# Patient Record
Sex: Male | Born: 1980 | Race: Black or African American | Hispanic: No | Marital: Single | Smoking: Current every day smoker
Health system: Southern US, Community
[De-identification: ages and names within clinical notes are randomized; demographics above are authoritative.]

---

## 2015-11-11 ENCOUNTER — Emergency Department (HOSPITAL_BASED_OUTPATIENT_CLINIC_OR_DEPARTMENT_OTHER)
Admission: EM | Admit: 2015-11-11 | Discharge: 2015-11-11 | Disposition: A | Discharge status: Home / Self Care | Payer: Self-pay | Attending: Emergency Medicine | Admitting: Emergency Medicine

## 2015-11-11 ENCOUNTER — Emergency Department (HOSPITAL_BASED_OUTPATIENT_CLINIC_OR_DEPARTMENT_OTHER): Payer: Self-pay

## 2015-11-11 ENCOUNTER — Encounter (HOSPITAL_BASED_OUTPATIENT_CLINIC_OR_DEPARTMENT_OTHER): Payer: Self-pay | Admitting: *Deleted

## 2015-11-11 DIAGNOSIS — R0602 Shortness of breath: Secondary | ICD-10-CM | POA: Insufficient documentation

## 2015-11-11 DIAGNOSIS — R197 Diarrhea, unspecified: Secondary | ICD-10-CM | POA: Insufficient documentation

## 2015-11-11 DIAGNOSIS — R630 Anorexia: Secondary | ICD-10-CM | POA: Insufficient documentation

## 2015-11-11 DIAGNOSIS — R110 Nausea: Secondary | ICD-10-CM | POA: Insufficient documentation

## 2015-11-11 DIAGNOSIS — R079 Chest pain, unspecified: Secondary | ICD-10-CM | POA: Insufficient documentation

## 2015-11-11 DIAGNOSIS — F17210 Nicotine dependence, cigarettes, uncomplicated: Secondary | ICD-10-CM | POA: Insufficient documentation

## 2015-11-11 LAB — CBC
HCT: 45.9 % (ref 39.0–52.0)
HEMOGLOBIN: 15.6 g/dL (ref 13.0–17.0)
MCH: 28.5 pg (ref 26.0–34.0)
MCHC: 34 g/dL (ref 30.0–36.0)
MCV: 83.8 fL (ref 78.0–100.0)
Platelets: 206 10*3/uL (ref 150–400)
RBC: 5.48 MIL/uL (ref 4.22–5.81)
RDW: 13.7 % (ref 11.5–15.5)
WBC: 6 10*3/uL (ref 4.0–10.5)

## 2015-11-11 LAB — BASIC METABOLIC PANEL
Anion gap: 9 (ref 5–15)
BUN: 15 mg/dL (ref 6–20)
CALCIUM: 8.6 mg/dL — AB (ref 8.9–10.3)
CO2: 25 mmol/L (ref 22–32)
CREATININE: 1 mg/dL (ref 0.61–1.24)
Chloride: 103 mmol/L (ref 101–111)
GFR calc Af Amer: >60 mL/min (ref >60)
GFR calc non Af Amer: >60 mL/min (ref >60)
GLUCOSE: 126 mg/dL — AB (ref 65–99)
Potassium: 3.9 mmol/L (ref 3.5–5.1)
Sodium: 137 mmol/L (ref 135–145)

## 2015-11-11 LAB — TROPONIN I

## 2015-11-11 MED ORDER — IOHEXOL 350 MG/ML SOLN
100.0000 mL | Freq: Once | INTRAVENOUS | Status: AC | PRN
  Administered 2015-11-11: 100 mL via INTRAVENOUS

## 2015-11-11 NOTE — ED Provider Notes (Signed)
CSN: [id number]     Arrival date & time 11/11/15  1349 History   First MD Initiated Contact with Patient 11/11/15 1500     Chief Complaint  Patient presents with  . Chest Pain     (Consider location/radiation/quality/duration/timing/severity/associated sxs/prior Treatment) HPI   Patient is a 35 year old male with no pertinent past medical history who presents the ED with complaint of chest pain, onset 1 month. Patient reports having a constant dull ache to his left lateral chest for the past month. Reports pain as 4/10. Denies any aggravating or alleviating factors. Patient also reports he has been feeling short of breath for the past month and further explains "I feel like I just can't take a full deep breath". Patient also reports having decreased energy and decreased appetite. He notes he has felt nauseous daily for the past week and reports dry heaving. Patient reports having proximally 3 episodes of nonbloody diarrhea daily for the past month. Denies fever, chills, headache, lightheadedness, dizziness, nasal congestion, rhinorrhea, sore throat, cough, palpitations, wheezing, abdominal pain, vomiting, urinary symptoms, numbness, tingling, weakness. Patient reports he quit smoking 2 weeks ago but endorses smoking 1 pack per day. Patient reports having an extensive family cardiac history. He notes his dad died at the age of 22 from an MI in 23 of his brothers died from MIs at the ages of 76, 27 and 48.   History reviewed. No pertinent past medical history. History reviewed. No pertinent past surgical history. History reviewed. No pertinent family history. Social History  Substance Use Topics  . Smoking status: Current Every Day Smoker    Types: Cigarettes  . Smokeless tobacco: None  . Alcohol Use: No    Review of Systems  Constitutional: Positive for appetite change (decreased) and fatigue.  Respiratory: Positive for shortness of breath.   Cardiovascular: Positive for chest pain.   Gastrointestinal: Positive for nausea and diarrhea.  All other systems reviewed and are negative.     Allergies  Review of patient's allergies indicates no known allergies.  Home Medications   Prior to Admission medications   Not on File   BP 128/90 mmHg  Pulse 70  Temp(Src) 97.9 F (36.6 C) (Oral)  Resp 16  Ht  (1.88 m)  Wt 113.399 kg  BMI 32.08 kg/m2  SpO2 98% Physical Exam  Constitutional: He is oriented to person, place, and time. He appears well-developed and well-nourished. No distress.  HENT:  Head: Normocephalic and atraumatic.  Mouth/Throat: Oropharynx is clear and moist. No oropharyngeal exudate.  Eyes: Conjunctivae and EOM are normal. Right eye exhibits no discharge. Left eye exhibits no discharge. No scleral icterus.  Neck: Normal range of motion. Neck supple.  Cardiovascular: Normal rate, regular rhythm, normal heart sounds and intact distal pulses.   Pulmonary/Chest: Effort normal and breath sounds normal. No respiratory distress. He has no wheezes. He has no rales. He exhibits no tenderness.  Abdominal: Soft. Bowel sounds are normal. He exhibits no distension and no mass. There is no tenderness. There is no rebound and no guarding.  Musculoskeletal: Normal range of motion. He exhibits no edema.  Lymphadenopathy:    He has no cervical adenopathy.  Neurological: He is alert and oriented to person, place, and time.  Skin: Skin is warm and dry. He is not diaphoretic.  Nursing note and vitals reviewed.   ED Course  Procedures (including critical care time) Labs Review Labs Reviewed  BASIC METABOLIC PANEL - Abnormal; Notable for the following:    Glucose,  Bld 126 (*)    Calcium 8.6 (*)    All other components within normal limits  CBC  TROPONIN I    Imaging Review Dg Chest 2 View  11/11/2015  CLINICAL DATA:  Mid chest pain and shortness of breath for 1 month. EXAM: CHEST  2 VIEW COMPARISON:  None. FINDINGS: The heart size and mediastinal contours  are within normal limits. Both lungs are clear. The visualized skeletal structures are unremarkable. IMPRESSION: No active cardiopulmonary disease. Electronically Signed   By: Kennith CenterEric  Mansell M.D.   On: 11/11/2015 15:11   Ct Angio Chest Pe W/cm &/or Wo Cm  11/11/2015  CLINICAL DATA:  Chest pain, shortness of Breath. EXAM: CT ANGIOGRAPHY CHEST WITH CONTRAST TECHNIQUE: Multidetector CT imaging of the chest was performed using the standard protocol during bolus administration of intravenous contrast. Multiplanar CT image reconstructions and MIPs were obtained to evaluate the vascular anatomy. CONTRAST:  100mL OMNIPAQUE IOHEXOL 350 MG/ML SOLN COMPARISON:  11/11/2015 chest x-ray FINDINGS: Mediastinum/Nodes: No filling defects in the pulmonary arteries to suggest pulmonary emboli. Heart is normal size. Aorta is normal caliber. No mediastinal, hilar, or axillary adenopathy. Lungs/Pleura: Lungs are clear. No focal airspace opacities or suspicious nodules. No effusions. Upper abdomen: Imaging into the upper abdomen shows no acute findings. Musculoskeletal: No acute bony abnormality or focal bone lesion. Review of the MIP images confirms the above findings. IMPRESSION: No evidence of pulmonary embolus.  No acute findings. Electronically Signed   By: Charlett NoseKevin  Dover M.D.   On: 11/11/2015 17:09   I have personally reviewed and evaluated these images and lab results as part of my medical decision-making.   EKG Interpretation   Date/Time:  Saturday November 11 2015 14:00:17 EST Ventricular Rate:  73 PR Interval:  172 QRS Duration: 104 QT Interval:  374 QTC Calculation: 412 R Axis:   50 Text Interpretation:  Normal sinus rhythm Normal ECG No old tracing to  compare Confirmed by JACUBOWITZ  MD, SAM [phone]([id number]) on 11/11/2015 2:07:50 PM      MDM   Final diagnoses:  Chest pain, unspecified chest pain type  SOB (shortness of breath)    Patient presents with constant chest pain and shortness of breath that he has had  daily for the past month. Endorses extensive family cardiac history, denies any personal cardiac history, hospitalizations or cardiac workup. [place] VSS. Exam unremarkable. EKG revealed normal sinus rhythm, no old tracings to compare. Chest x-ray negative. Troponin negative. Labs unremarkable. Due to patient's continued symptoms of chest pain and shortness of breath, will order CT angio of chest to further evaluation. CT angio negative. I have a low suspicion for ACS, PE, dissection, or other acute cardiac event at this time. Discussed results and plan for discharge with patient. Patient given resources to follow up with PCP and cardiology.  Evaluation does not show pathology requring ongoing emergent intervention or admission. Pt is hemodynamically stable and mentating appropriately. Discussed findings/results and plan with patient/guardian, who agrees with plan. All questions answered. Return precautions discussed and outpatient follow up given.        Satira Sarkicole Elizabeth KilbourneNadeau, [place] JerseyPA-C 11/11/15 1727  Arby BarretteMarcy Pfeiffer, MD 11/15/15 0800

## 2015-11-11 NOTE — ED Notes (Signed)
Pt placed on Automatic Vitals and continuous cardiac monitoring and pulse ox.

## 2015-11-11 NOTE — ED Notes (Signed)
Extensive family hx of cardiac death early due to MI (3 brothers and dad before age 37).  Pt reports not feeling right, chest tightness and SOB over the last couple weeks.  Decreased appetite, difficulty sleeping.

## 2015-11-11 NOTE — Discharge Instructions (Signed)
I recommend taking ibuprofen as prescribed over-the-counter as needed for chest pain. Call the cardiology clinic listed above to schedule a follow-up appointment. Please return to the Emergency Department if symptoms worsen or new onset of fever, difficulty breathing, cough, wheezing, palpitations, abdominal pain, nausea, vomiting, lightheadedness, dizziness, numbness, tingling, weakness, leg swelling.

## 2017-09-04 IMAGING — CT CT ANGIO CHEST
2 of 10 series · 19 of 46 positions shown · IV contrast (omnipaque)
Comparison: 11/11/2015 chest x-ray

CLINICAL DATA: Chest pain, shortness of Breath.

EXAM:
CT ANGIOGRAPHY CHEST WITH CONTRAST
TECHNIQUE: Multidetector CT imaging of the chest was performed using the
standard protocol during bolus administration of intravenous
contrast. Multiplanar CT image reconstructions and MIPs were
obtained to evaluate the vascular anatomy.
CONTRAST:  100mL OMNIPAQUE IOHEXOL 350 MG/ML SOLN

[Series 8: thins · axial · 0.83mm/px · z∈[-322,-35]mm · 16 of 319 slices shown]
[im 16/319  lung]
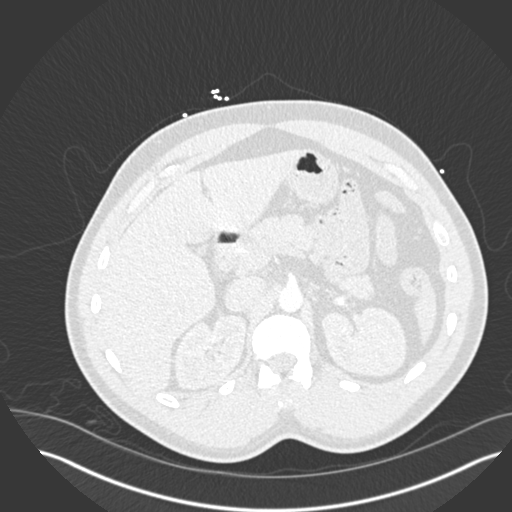
[im 31/319  soft-tissue]
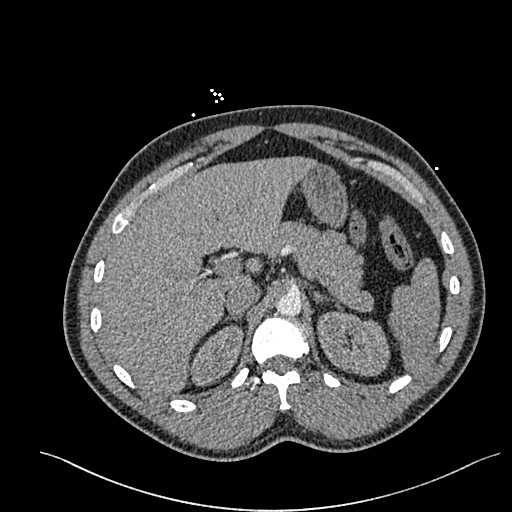
[im 61/319  lung]
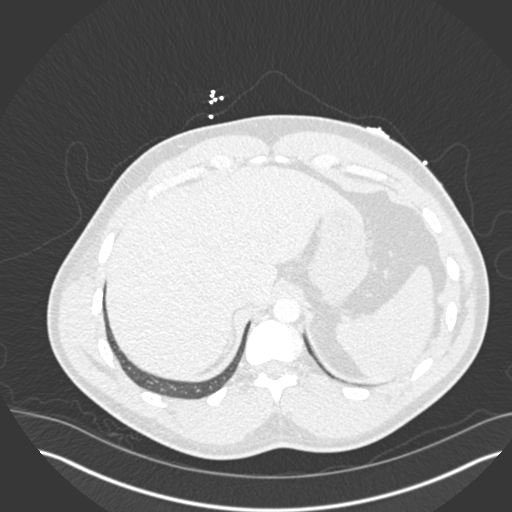
[im 76/319  soft-tissue]
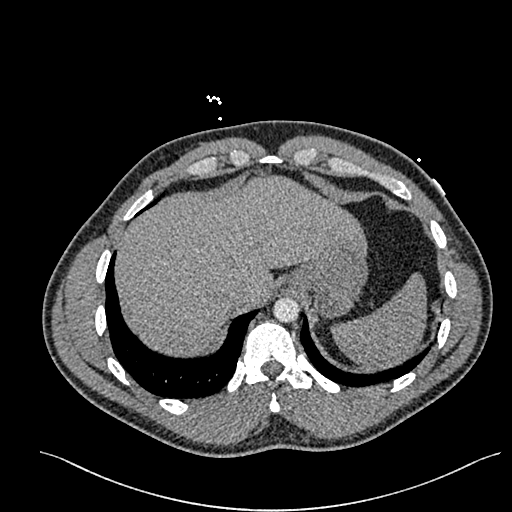
[im 91/319  lung]
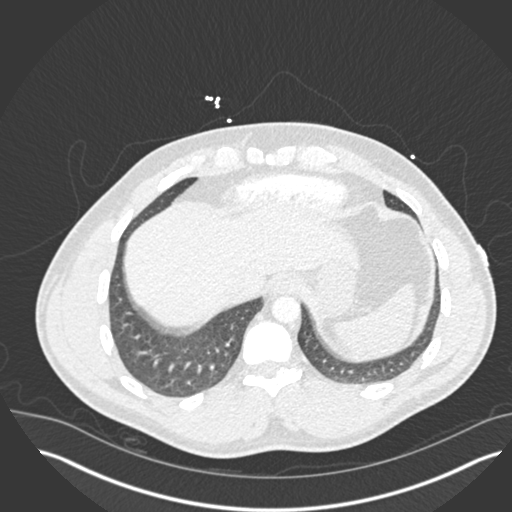
[im 107/319  soft-tissue]
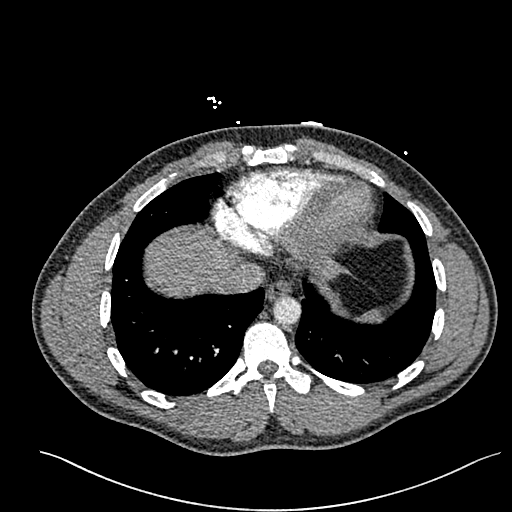
[im 137/319  lung]
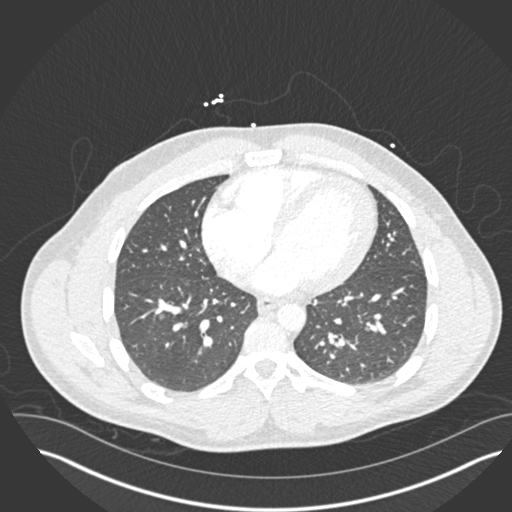
[im 152/319  soft-tissue]
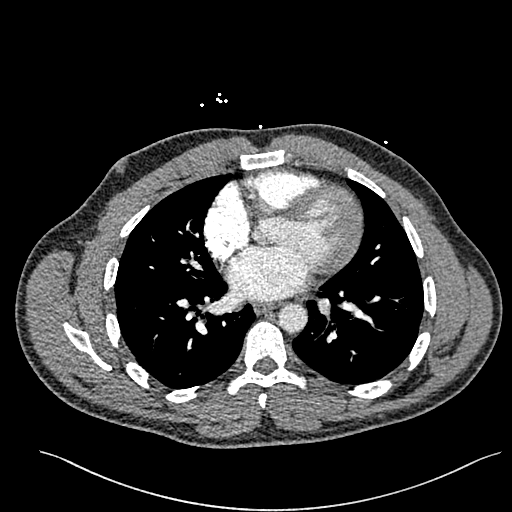
[im 167/319  lung]
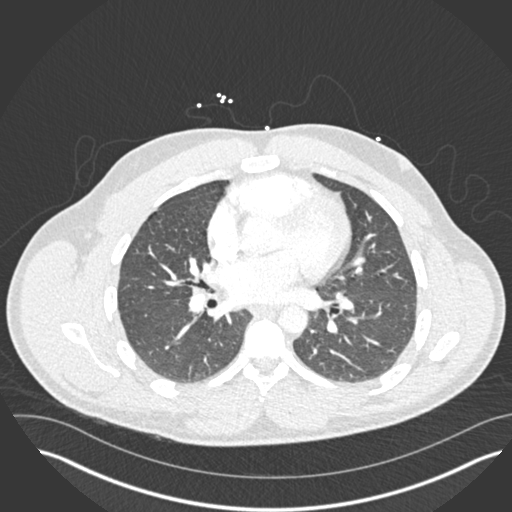
[im 182/319  soft-tissue]
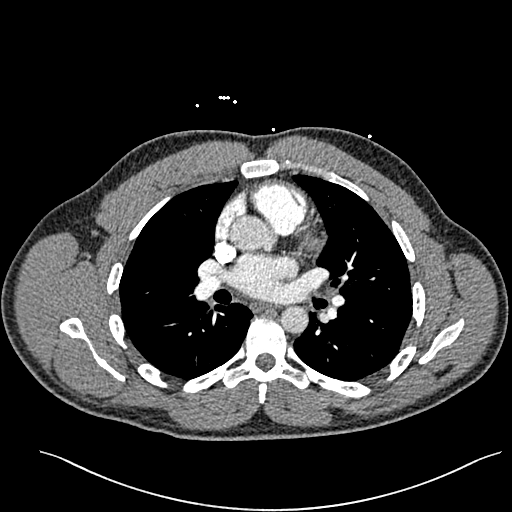
[im 213/319  lung]
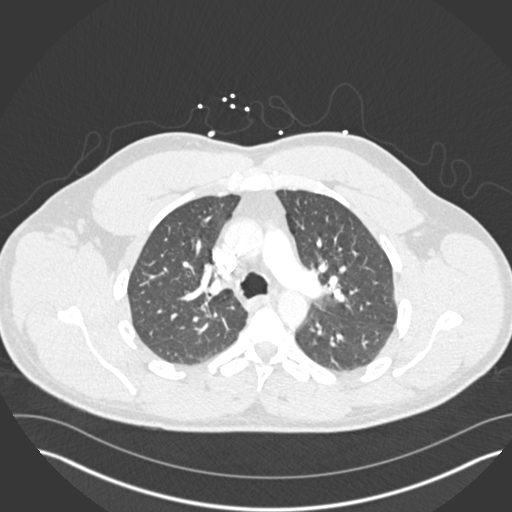
[im 228/319  soft-tissue]
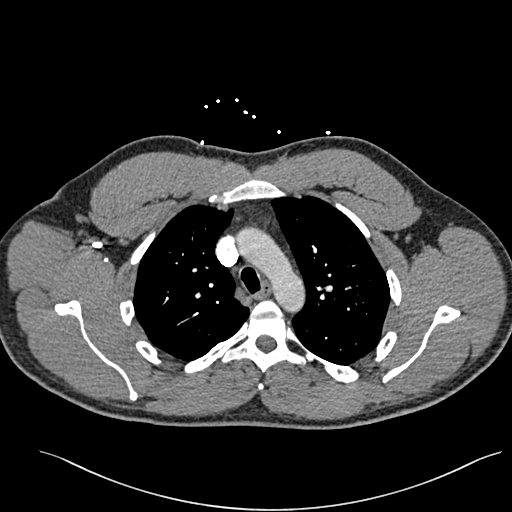
[im 243/319  lung]
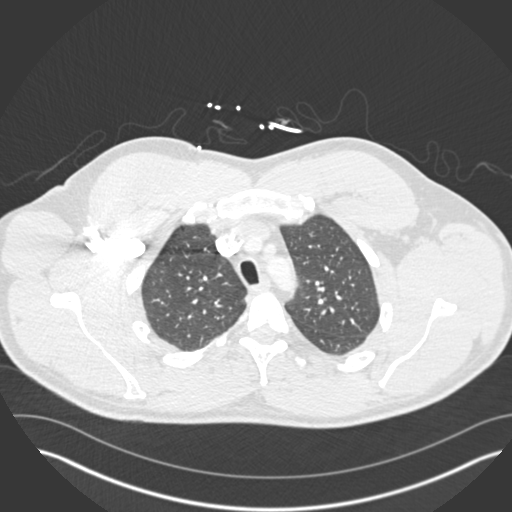
[im 258/319  soft-tissue]
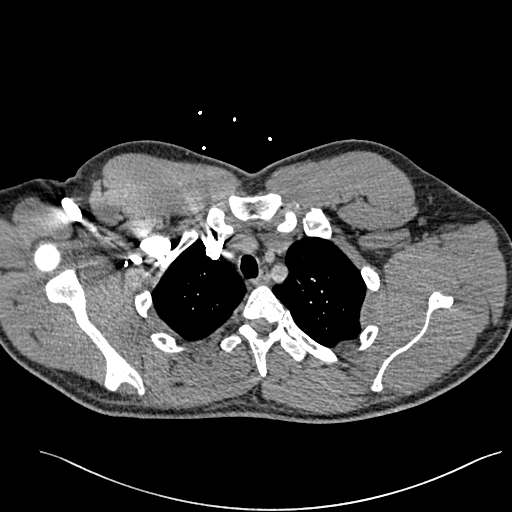
[im 288/319  lung]
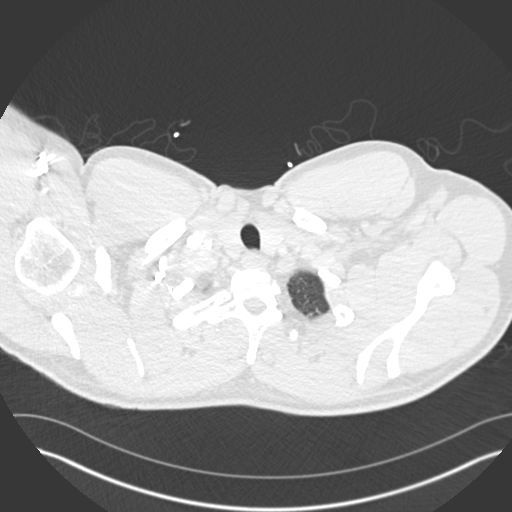
[im 303/319  soft-tissue]
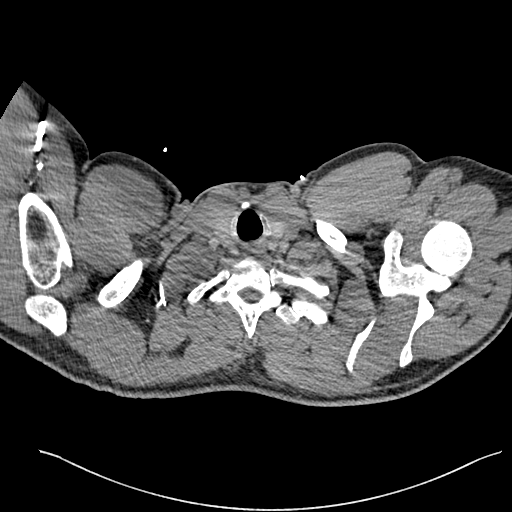

[Series 9: coronal mpr · coronal · 0.66mm/px · 3 of 127 slices shown]
[im 32/127  soft-tissue]
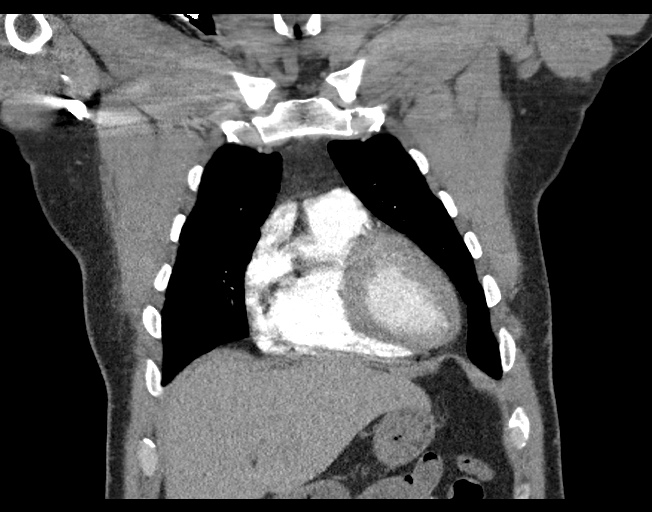
[im 64/127  soft-tissue]
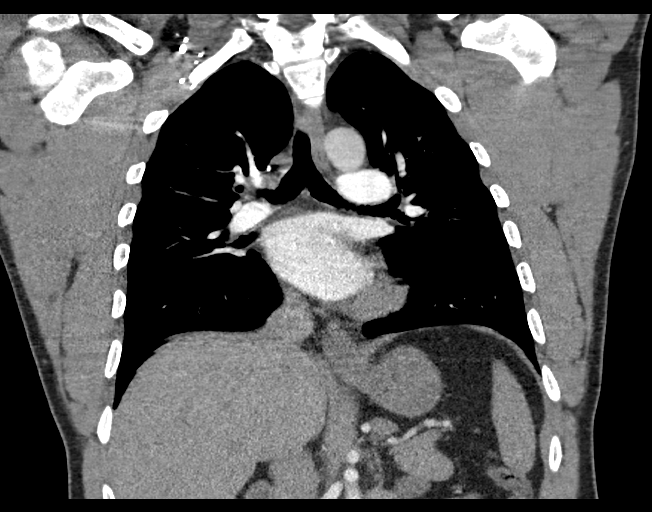
[im 95/127  soft-tissue]
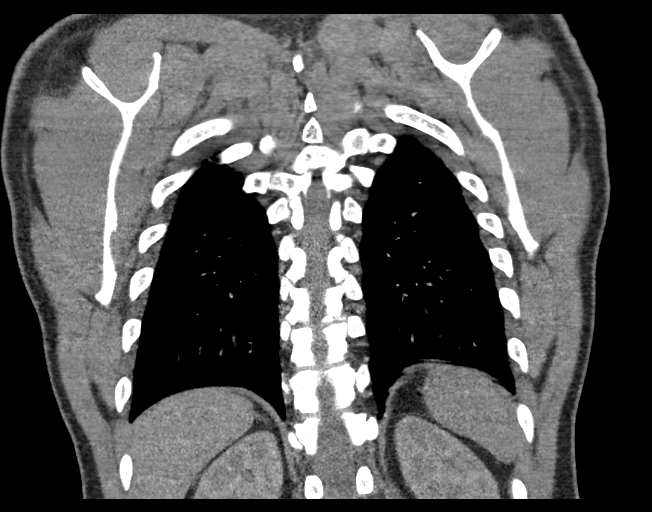

[19 of 46 positions shown; findings below may reference images not displayed]

FINDINGS: Mediastinum/Nodes: No filling defects in the pulmonary arteries to
suggest pulmonary emboli. Heart is normal size. Aorta is normal
caliber. No mediastinal, hilar, or axillary adenopathy.

Lungs/Pleura: Lungs are clear. No focal airspace opacities or
suspicious nodules. No effusions.

Upper abdomen: Imaging into the upper abdomen shows no acute
findings.

Musculoskeletal: No acute bony abnormality or focal bone lesion.

Review of the MIP images confirms the above findings.
IMPRESSION: No evidence of pulmonary embolus.  No acute findings.
